# Patient Record
Sex: Male | Born: 1975 | Race: Black or African American | Hispanic: No | Marital: Single
Health system: Southern US, Community
[De-identification: ages and names within clinical notes are randomized; demographics above are authoritative.]

---

## 2017-03-18 ENCOUNTER — Emergency Department (HOSPITAL_COMMUNITY)
Admission: EM | Admit: 2017-03-18 | Discharge: 2017-03-19 | Disposition: A | Discharge status: Home / Self Care | Payer: Self-pay | Attending: Emergency Medicine | Admitting: Emergency Medicine

## 2017-03-18 ENCOUNTER — Encounter (HOSPITAL_COMMUNITY): Payer: Self-pay | Admitting: Emergency Medicine

## 2017-03-18 ENCOUNTER — Emergency Department (HOSPITAL_COMMUNITY): Payer: Self-pay

## 2017-03-18 DIAGNOSIS — S0181XA Laceration without foreign body of other part of head, initial encounter: Secondary | ICD-10-CM | POA: Insufficient documentation

## 2017-03-18 DIAGNOSIS — Y929 Unspecified place or not applicable: Secondary | ICD-10-CM | POA: Insufficient documentation

## 2017-03-18 DIAGNOSIS — Y999 Unspecified external cause status: Secondary | ICD-10-CM | POA: Insufficient documentation

## 2017-03-18 DIAGNOSIS — Z23 Encounter for immunization: Secondary | ICD-10-CM | POA: Insufficient documentation

## 2017-03-18 DIAGNOSIS — Y939 Activity, unspecified: Secondary | ICD-10-CM | POA: Insufficient documentation

## 2017-03-18 MED ORDER — LIDOCAINE-EPINEPHRINE (PF) 2 %-1:200000 IJ SOLN
20.0000 mL | Freq: Once | INTRAMUSCULAR | Status: AC
  Administered 2017-03-18: 20 mL
  Filled 2017-03-18: qty 20

## 2017-03-18 MED ORDER — LIDOCAINE-EPINEPHRINE-TETRACAINE (LET) SOLUTION
3.0000 mL | Freq: Once | NASAL | Status: AC
  Administered 2017-03-19: 3 mL via TOPICAL
  Filled 2017-03-18: qty 3
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
## 2017-03-19 MED ORDER — TETANUS-DIPHTH-ACELL PERTUSSIS 5-2.5-18.5 LF-MCG/0.5 IM SUSP
0.5000 mL | Freq: Once | INTRAMUSCULAR | Status: AC
  Administered 2017-03-19: 0.5 mL via INTRAMUSCULAR
  Filled 2017-03-19: qty 0.5

## 2017-03-19 NOTE — ED Notes (Signed)
Pt in Ct  
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
